# Patient Record
Sex: Female | Born: 1975 | Race: Black or African American | Hispanic: No | Marital: Single | Smoking: Never smoker
Health system: Southern US, Community
[De-identification: ages and names within clinical notes are randomized; demographics above are authoritative.]

## PROBLEM LIST (undated history)

## (undated) DIAGNOSIS — Z309 Encounter for contraceptive management, unspecified: Secondary | ICD-10-CM

## (undated) HISTORY — DX: Encounter for contraceptive management, unspecified: Z30.9

---

## 2001-02-26 ENCOUNTER — Other Ambulatory Visit: Admission: RE | Admit: 2001-02-26 | Discharge: 2001-02-26 | Payer: Self-pay | Admitting: *Deleted

## 2007-05-09 ENCOUNTER — Inpatient Hospital Stay (HOSPITAL_COMMUNITY): Admission: AD | Admit: 2007-05-09 | Discharge: 2007-05-11 | Payer: Self-pay | Admitting: Obstetrics & Gynecology

## 2007-08-21 ENCOUNTER — Other Ambulatory Visit: Admission: RE | Admit: 2007-08-21 | Discharge: 2007-08-21 | Payer: Self-pay | Admitting: Obstetrics and Gynecology

## 2007-12-27 ENCOUNTER — Other Ambulatory Visit: Admission: RE | Admit: 2007-12-27 | Discharge: 2007-12-27 | Payer: Self-pay | Admitting: Obstetrics and Gynecology

## 2008-08-27 ENCOUNTER — Other Ambulatory Visit: Admission: RE | Admit: 2008-08-27 | Discharge: 2008-08-27 | Payer: Self-pay | Admitting: Obstetrics & Gynecology

## 2008-12-31 ENCOUNTER — Other Ambulatory Visit: Admission: RE | Admit: 2008-12-31 | Discharge: 2008-12-31 | Payer: Self-pay | Admitting: Obstetrics and Gynecology

## 2009-08-31 ENCOUNTER — Other Ambulatory Visit: Admission: RE | Admit: 2009-08-31 | Discharge: 2009-08-31 | Payer: Self-pay | Admitting: Obstetrics & Gynecology

## 2010-01-01 ENCOUNTER — Other Ambulatory Visit: Admission: RE | Admit: 2010-01-01 | Discharge: 2010-01-01 | Payer: Self-pay | Admitting: Obstetrics and Gynecology

## 2011-03-22 NOTE — H&P (Signed)
NAMEJOYELL, Yesenia Hays              ACCOUNT NO.:  [id number]   MEDICAL RECORD NO.:  [id number]          PATIENT TYPE:  INP   LOCATION:  9175                          FACILITY:  [facility]   PHYSICIAN:  Tilda Burrow, M.D. DATE OF BIRTH:  07/10/1976   DATE OF ADMISSION:  05/09/2007  DATE OF DISCHARGE:                              HISTORY & PHYSICAL   ADMISSION DIAGNOSIS:  Pregnancy, 39+ weeks gestation.  Advanced cervical  favorability.  Elective induction of labor.   HISTORY OF PRESENT ILLNESS:  This 35 year old female gravida 3, para 1,  AB 1, LMP August 07, 2006, placing menstrual EDC on May 15, 2007 with  ultrasound-assigned [facility] of July 8 and July 3, based on first trimester  and second trimester ultrasounds.  The third trimester ultrasound  confirmed a healthy growth pattern.  The patient is admitted after being  seen in our office with a steady progression of her cervix to 3-4 cm,  50%, -1 mid position cervix, vertex presentation.  Patient requests  induction, so plans are to proceed with induction at this time.  She is  aware of the usual risks that labor can incur with induced labors,  including the need for emergency interventions, including cesarean  delivery.  Patient plans permanent contraception by tubal ligation after  her four week checkup.   PAST MEDICAL HISTORY:  Benign.   PAST SURGICAL HISTORY:  Negative.   ALLERGIES:  None.   Single.  Lives with her daughter.  Works at [employer] as a  Sports coach.   She wants an epidural if necessary.  Will attempt IV analgesics first.  Plans to take the baby to Dr. Milford Cage of [facility].  Plans  to bottle-feed.  The infant is a female.   PHYSICAL EXAMINATION:  GENERAL:  A slim-statured African-American  female.  VITAL SIGNS:  Height 5 feet 4.  Weight 142.  Blood pressure 112/68.  HEENT:  Pupils are equal, round and reactive.  NECK:  Supple.  CARDIOVASCULAR:  Unremarkable.  ABDOMEN:  Fundal height 37  cm.  Vertex presentation on the infant.  EXTREMITIES:  Grossly normal without clubbing, cyanosis or edema.   PRENATAL LABS:  Blood type O+.  Urine drug screen negative.  Rubella  immunity present.  Hemoglobin 12, hematocrit 38.  Hepatitis, HIV, RPR,  GC, and Chlamydia are negative.  Group B strep negative.  Sickledex  negative.   PLAN:  Admit on the evening of the 2nd for induction of labor by either  Pitocin induction, Cytotec, or Cervidil.      Tilda Burrow, M.D.  Electronically Signed     JVF/MEDQ  D:  05/09/2007  T:  05/10/2007  Job:  [id number]   cc:   Francoise Schaumann. Milford Cage DO, FAAP  Fax: [phone]

## 2011-03-22 NOTE — Op Note (Signed)
NAMEQUENTIN, STREBEL              ACCOUNT NO.:  [id number]   MEDICAL RECORD NO.:  [id number]          PATIENT TYPE:  INP   LOCATION:  9175                          FACILITY:  [facility]   PHYSICIAN:  Lazaro Arms, M.D.   DATE OF BIRTH:  01-Apr-1976   DATE OF PROCEDURE:  05/10/2007  DATE OF DISCHARGE:                               OPERATIVE REPORT   Yesenia Hays is a 35 year old African-American female, gravida 3, para 1,  abortus 1, being induced electively for a favorable cervix who had an  epidural placed during labor.  She progressed nicely through the first  stage of labor, had pretty significant variable decelerations in the  deceleration phase of labor, pushed for about 15 minutes and over an  intact perineum, delivered a viable female infant at [phone] with Apgars of  7 and 9, weighing 7 pounds and 7 ounces.  There was 3 vessel cord.  Cord  blood and cord gas were sent.  The perineum was intact without  lacerations.  The placenta delivered spontaneously and intact.  The  uterus was firm, 3 fingerbreadths below the umbilicus, and bleeding for  the delivery was probably less than 100 mL.  There was a body cord.  The  cord was actually looped around the back and then over the right  shoulder and then back to the umbilicus, and that was, I am sure, the  cause of decelerations.  There was not a nuchal cord or any other  compromise.  The cord pH was 7.1; the pCO2 is still pending, but my  impression is this would represent a respiratory acidosis purely.  There  was no evidence of any metabolic acidosis at all.  The patient will  undergo routine postpartum care.  The infant will go to routine neonatal  care, and she is group B strep negative.      Lazaro Arms, M.D.  Electronically Signed     LHE/MEDQ  D:  05/10/2007  T:  05/10/2007  Job:  [id number]

## 2011-03-22 NOTE — H&P (Signed)
Yesenia Hays, CAPSHAW              ACCOUNT NO.:  [id number]   MEDICAL RECORD NO.:  [id number]          PATIENT TYPE:  INP   LOCATION:  9175                          [facility]ACILITY[facility]  [facility]   PHYSICIAN:  Lazaro Arms, M.D.   DATE OF BIRTH:  1976/04/16   DATE OF ADMISSION:  05/09/2007  DATE OF DISCHARGE:  LH                              HISTORY & PHYSICAL   HISTORY AND PHYSICAL:  Yesenia Hays is a 35 year old gravida 3, para 1,  abortus 1, estimated date of delivery of May 15, 2007, currently at 43-  2/7 weeks' gestation, who was admitted for induction of labor with a  favorable cervix.  Dr. Emelda Fear arranged the induction.  It is elective,  but she is 4 cm.  The patient comes in not having complaints, has  reactive NST, no bleeding, no contractions, no complaints.  Her cervix  is 4, 50, and 01, vertex, very soft and midline.   PAST MEDICAL HISTORY:  Negative.   PAST SURGICAL HISTORY:  Negative.   PAST OBSTETRICAL HISTORY:  She had a miscarriage back in 2007.  She had  a vaginal delivery back in 2001, 7 pound 4 ounce, was induced for post  dates at 40 weeks 5 days.   Blood type is O positive.  Urine drug screen is negative.  Varicella is  immune.  Rubella is immune.  Hepatitis B was negative.  HIV was  nonreactive x2.  HSV was negative.  Serology was negative x2.  GC and  chlamydia were negative x2.  Pap smear was normal.  ARP was normal.  Group B strep was negative.  Glucola was 69.   REVIEW OF SYSTEMS:  Otherwise negative.   IMPRESSION:  1. Intrauterine pregnancy at 39-2/7 weeks' gestation.  2. Elective induction of labor with favorable cervix.   PLAN:  The patient is admitted for Pitocin induction of labor, to be  followed by amniotomy for expectant vaginal delivery.      Lazaro Arms, M.D.  Electronically Signed     LHE/MEDQ  D:  05/10/2007  T:  05/10/2007  Job:  [id number]

## 2011-08-23 LAB — CBC
HCT: 34.7 — ABNORMAL LOW
Hemoglobin: 9.5 — ABNORMAL LOW
Platelets: 316
RBC: 3.52 — ABNORMAL LOW
RDW: 16.6 — ABNORMAL HIGH
WBC: 14.4 — ABNORMAL HIGH
WBC: 8.6

## 2011-08-23 LAB — RPR: RPR Ser Ql: NONREACTIVE

## 2012-01-20 ENCOUNTER — Other Ambulatory Visit (HOSPITAL_COMMUNITY)
Admission: RE | Admit: 2012-01-20 | Discharge: 2012-01-20 | Disposition: A | Discharge status: Home / Self Care | Payer: Self-pay | Source: Ambulatory Visit | Attending: Obstetrics & Gynecology | Admitting: Obstetrics & Gynecology

## 2012-01-20 DIAGNOSIS — Z01419 Encounter for gynecological examination (general) (routine) without abnormal findings: Secondary | ICD-10-CM | POA: Insufficient documentation

## 2013-04-15 ENCOUNTER — Other Ambulatory Visit: Payer: Self-pay | Admitting: Obstetrics and Gynecology

## 2015-02-06 ENCOUNTER — Other Ambulatory Visit (HOSPITAL_COMMUNITY)
Admission: RE | Admit: 2015-02-06 | Discharge: 2015-02-06 | Disposition: A | Discharge status: Home / Self Care | Payer: BLUE CROSS/BLUE SHIELD | Source: Ambulatory Visit | Attending: Adult Health | Admitting: Adult Health

## 2015-02-06 ENCOUNTER — Ambulatory Visit (INDEPENDENT_AMBULATORY_CARE_PROVIDER_SITE_OTHER): Payer: BLUE CROSS/BLUE SHIELD | Admitting: Adult Health

## 2015-02-06 ENCOUNTER — Encounter: Payer: Self-pay | Admitting: Adult Health

## 2015-02-06 VITALS — BP 110/60 | HR 60 | Ht 63.0 in | Wt 162.0 lb

## 2015-02-06 DIAGNOSIS — Z1151 Encounter for screening for human papillomavirus (HPV): Secondary | ICD-10-CM | POA: Diagnosis present

## 2015-02-06 DIAGNOSIS — Z01419 Encounter for gynecological examination (general) (routine) without abnormal findings: Secondary | ICD-10-CM | POA: Insufficient documentation

## 2015-02-06 DIAGNOSIS — Z309 Encounter for contraceptive management, unspecified: Secondary | ICD-10-CM | POA: Insufficient documentation

## 2015-02-06 DIAGNOSIS — Z30011 Encounter for initial prescription of contraceptive pills: Secondary | ICD-10-CM

## 2015-02-06 HISTORY — DX: Encounter for contraceptive management, unspecified: Z30.9

## 2015-02-06 MED ORDER — NORETHIN-ETH ESTRAD-FE BIPHAS 1 MG-10 MCG / 10 MCG PO TABS: 1.0000 | ORAL_TABLET | Freq: Every day | ORAL | Status: DC

## 2015-02-06 NOTE — Patient Instructions (Signed)
Start lo loestrin with next period Use condoms Return in 3 months and get fasting labs Physical in 1 year Mammogram yearly at 72 Ethinyl Estradiol; Norethindrone Acetate; Ferrous fumarate tablets (contraception) What is this medicine? ETHINYL ESTRADIOL; NORETHINDRONE ACETATE; FERROUS FUMARATE (ETH in il es tra DYE ole; nor eth IN drone AS e tate; FER [place] FUE ma rate) is an oral contraceptive. The products combine two types of female hormones, an estrogen and a progestin. They are used to prevent ovulation and pregnancy. Some products are also used to treat acne in females. This medicine may be used for other purposes; ask your health care provider or pharmacist if you have questions. COMMON BRAND NAME(S): Estrostep Fe, Gildess Fe 1.5/30, Gildess Fe 1/20, Junel Fe 1.5/30, Junel Fe 1/20, Larin Fe, Lo Loestrin Fe, Loestrin 24 Fe, Loestrin FE 1.5/30, Loestrin FE 1/20, Lomedia 24 Fe, Microgestin Fe 1.5/30, Microgestin Fe 1/20, Tarina Fe 1/20, Tilia Fe, Tri-Legest Fe What should I tell my health care provider before I take this medicine? They need to know if you have any of these conditions: -abnormal vaginal bleeding -blood vessel disease -breast, cervical, endometrial, ovarian, liver, or uterine cancer -diabetes -gallbladder disease -heart disease or recent heart attack -high blood pressure -high cholesterol -history of blood clots -kidney disease -liver disease -migraine headaches -smoke tobacco -stroke -systemic lupus erythematosus (SLE) -an unusual or allergic reaction to estrogens, progestins, other medicines, foods, dyes, or preservatives -pregnant or trying to get pregnant -breast-feeding How should I use this medicine? Take this medicine by mouth. To reduce nausea, this medicine may be taken with food. Follow the directions on the prescription label. Take this medicine at the same time each day and in the order directed on the package. Do not take your medicine more often than  directed. A patient package insert for the product will be given with each prescription and refill. Read this sheet carefully each time. The sheet may change frequently. Contact your pediatrician regarding the use of this medicine in children. Special care may be needed. This medicine has been used in female children who have started having menstrual periods. Overdosage: If you think you've taken too much of this medicine contact a poison control center or emergency room at once. Overdosage: If you think you have taken too much of this medicine contact a poison control center or emergency room at once. NOTE: This medicine is only for you. Do not share this medicine with others. What if I miss a dose? If you miss a dose, refer to the patient information sheet you received with your medicine for direction. If you miss more than one pill, this medicine may not be as effective and you may need to use another form of birth control. What may interact with this medicine? -acetaminophen -antibiotics or medicines for infections, especially rifampin, rifabutin, rifapentine, and griseofulvin, and possibly penicillins or tetracyclines -aprepitant -ascorbic acid (vitamin C) -atorvastatin -barbiturate medicines, such as phenobarbital -bosentan -carbamazepine -caffeine -clofibrate -cyclosporine -dantrolene -doxercalciferol -felbamate -grapefruit juice -hydrocortisone -medicines for anxiety or sleeping problems, such as diazepam or temazepam -medicines for diabetes, including pioglitazone -mineral oil -modafinil -mycophenolate -nefazodone -oxcarbazepine -phenytoin -prednisolone -ritonavir or other medicines for HIV infection or AIDS -rosuvastatin -selegiline -soy isoflavones supplements -St. John's wort -tamoxifen or raloxifene -theophylline -thyroid hormones -topiramate -warfarin This list may not describe all possible interactions. Give your health care provider a list of all the  medicines, herbs, non-prescription drugs, or dietary supplements you use. Also tell them if you smoke, drink alcohol, or  use illegal drugs. Some items may interact with your medicine. What should I watch for while using this medicine? Visit your doctor or health care professional for regular checks on your progress. You will need a regular breast and pelvic exam and Pap smear while on this medicine. Use an additional method of contraception during the first cycle that you take these tablets. If you have any reason to think you are pregnant, stop taking this medicine right away and contact your doctor or health care professional. If you are taking this medicine for hormone related problems, it may take several cycles of use to see improvement in your condition. Smoking increases the risk of getting a blood clot or having a stroke while you are taking birth control pills, especially if you are more than 39 years old. You are strongly advised not to smoke. This medicine can make your body retain fluid, making your fingers, hands, or ankles swell. Your blood pressure can go up. Contact your doctor or health care professional if you feel you are retaining fluid. This medicine can make you more sensitive to the sun. Keep out of the sun. If you cannot avoid being in the sun, wear protective clothing and use sunscreen. Do not use sun lamps or tanning beds/booths. If you wear contact lenses and notice visual changes, or if the lenses begin to feel uncomfortable, consult your eye care specialist. In some women, tenderness, swelling, or minor bleeding of the gums may occur. Notify your dentist if this happens. Brushing and flossing your teeth regularly may help limit this. See your dentist regularly and inform your dentist of the medicines you are taking. If you are going to have elective surgery, you may need to stop taking this medicine before the surgery. Consult your health care professional for advice. This  medicine does not protect you against HIV infection (AIDS) or any other sexually transmitted diseases. What side effects may I notice from receiving this medicine? Side effects that you should report to your doctor or health care professional as soon as possible: -allergic reactions like skin rash, itching or hives, swelling of the face, lips, or tongue -breast tissue changes or discharge -changes in vaginal bleeding during your period or between your periods -changes in vision -chest pain -confusion -coughing up blood -dizziness -feeling faint or lightheaded -headaches or migraines -leg, arm or groin pain -loss of balance or coordination -severe or sudden headaches -stomach pain (severe) -sudden shortness of breath -sudden numbness or weakness of the face, arm or leg -symptoms of vaginal infection like itching, irritation or unusual discharge -tenderness in the upper abdomen -trouble speaking or understanding -vomiting -yellowing of the eyes or skin Side effects that usually do not require medical attention (Report these to your doctor or health care professional if they continue or are bothersome.): -breakthrough bleeding and spotting that continues beyond the 3 initial cycles of pills -breast tenderness -mood changes, anxiety, depression, frustration, anger, or emotional outbursts -increased sensitivity to sun or ultraviolet light -nausea -skin rash, acne, or brown spots on the skin -weight gain (slight) This list may not describe all possible side effects. Call your doctor for medical advice about side effects. You may report side effects to FDA at 1-800-FDA-1088. Where should I keep my medicine? Keep out of the reach of children. Store at room temperature between 15 and 30 degrees C (59 and 86 degrees F). Throw away any unused medicine after the expiration date. NOTE: This sheet is a summary. It may not cover  all possible information. If you have questions about this  medicine, talk to your doctor, pharmacist, or health care provider.  2015, Elsevier/Gold Standard. (2013-02-27 15:05:22)

## 2015-02-06 NOTE — Progress Notes (Signed)
Patient ID: Yesenia LankSerena R Randon, female   DOB: 1976/08/30, 39 y.o.   MRN: [id number] History of Present Illness: Casimer BilisSerena is a 39 year old black female, single in for well woman gyn exam and pap and wants to get on OCs.Has been using condoms.  Current Medications, Allergies, Past Medical History, Past Surgical History, Family History and Social History were reviewed in [facility] electronic medical record.     Review of Systems: Patient denies any headaches, hearing loss, fatigue, blurred vision, shortness of breath, chest pain, abdominal pain, problems with bowel movements, urination, or intercourse. No joint pain or mood swings.    Physical Exam:BP 110/60 mmHg  Pulse 60  Ht 5\' 3"  (1.6 m)  Wt 162 lb (73.483 kg)  BMI 28.70 kg/m2  LMP 01/19/2015 General:  Well developed, well nourished, no acute distress Skin:  Warm and dry Neck:  Midline trachea, normal thyroid, good ROM, no lymphadenopathy Lungs; Clear to auscultation bilaterally Breast:  No dominant palpable mass, retraction, or nipple discharge Cardiovascular: Regular rate and rhythm Abdomen:  Soft, non tender, no hepatosplenomegaly Pelvic:  External genitalia is normal in appearance, no lesions.  The vagina is normal in appearance, with good color, moisture and rugae.Marland Kitchen. Urethra has no lesions or masses. The cervix is bulbous. Pap with HPV performed. Uterus is felt to be normal size, shape, and contour.  No adnexal masses or tenderness noted.Bladder is non tender, no masses felt. Extremities/musculoskeletal:  No swelling or varicosities noted, no clubbing or cyanosis Psych:  No mood changes, alert and cooperative,seems happy   Impression: Well woman gyn exam with pap Contraceptive management    Plan: Rx lo loestrin take 1 daily disp 1 pack with 11 refills, start with next period Use condoms Return in 3 months for follow up on the pill and get fasting labs Physical in 1 year Mammogram yearly at 40 Review handout on lo  loestrin

## 2015-02-09 LAB — CYTOLOGY - PAP

## 2015-05-08 ENCOUNTER — Other Ambulatory Visit: Payer: BLUE CROSS/BLUE SHIELD

## 2016-12-08 ENCOUNTER — Encounter: Payer: Self-pay | Admitting: Adult Health

## 2016-12-08 ENCOUNTER — Ambulatory Visit (INDEPENDENT_AMBULATORY_CARE_PROVIDER_SITE_OTHER): Payer: BLUE CROSS/BLUE SHIELD | Admitting: Adult Health

## 2016-12-08 VITALS — BP 120/78 | HR 88 | Ht 63.0 in | Wt 162.5 lb

## 2016-12-08 DIAGNOSIS — Z01419 Encounter for gynecological examination (general) (routine) without abnormal findings: Secondary | ICD-10-CM

## 2016-12-08 DIAGNOSIS — Z1211 Encounter for screening for malignant neoplasm of colon: Secondary | ICD-10-CM | POA: Diagnosis not present

## 2016-12-08 DIAGNOSIS — Z131 Encounter for screening for diabetes mellitus: Secondary | ICD-10-CM

## 2016-12-08 DIAGNOSIS — Z1212 Encounter for screening for malignant neoplasm of rectum: Secondary | ICD-10-CM

## 2016-12-08 LAB — HEMOCCULT GUIAC POC 1CARD (OFFICE): FECAL OCCULT BLD: NEGATIVE

## 2016-12-08 NOTE — Patient Instructions (Signed)
Physical and pap in 1 year Mammogram now and yearly

## 2016-12-08 NOTE — Progress Notes (Signed)
Patient ID: Yesenia Hays, female   DOB: 1976-03-31, 41 y.o.   MRN: [id number] History of Present Illness:  Yesenia Hays is a 41 year old black female in for a well woman gyn exam,she had a normal pap with negative HPV 02/06/15.She has no complaints today.  Current Medications, Allergies, Past Medical History, Past Surgical History, Family History and Social History were reviewed in [facility] electronic medical record.     Review of Systems: Patient denies any headaches, hearing loss, fatigue, blurred vision, shortness of breath, chest pain, abdominal pain, problems with bowel movements, urination, or intercourse. No joint pain or mood swings.She is using condoms and is happy with that.    Physical Exam:BP 120/78 (BP Location: Left Arm, Patient Position: Sitting, Cuff Size: Normal)   Pulse 88   Ht 5\' 3"  (1.6 m)   Wt 162 lb 8 oz (73.7 kg)   LMP 11/16/2016 (Exact Date)   BMI 28.79 kg/m  General:  Well developed, well nourished, no acute distress Skin:  Warm and dry Neck:  Midline trachea, normal thyroid, good ROM, no lymphadenopathy Lungs; Clear to auscultation bilaterally Breast:  No dominant palpable mass, retraction, or nipple discharge Cardiovascular: Regular rate and rhythm Abdomen:  Soft, non tender, no hepatosplenomegaly Pelvic:  External genitalia is normal in appearance, no lesions.  The vagina is normal in appearance. Urethra has no lesions or masses. The cervix is smooth.  Uterus is felt to be normal size, shape, and contour.  No adnexal masses or tenderness noted.Bladder is non tender, no masses felt. Rectal: Good sphincter tone, no polyps, or hemorrhoids felt.  Hemoccult negative. Extremities/musculoskeletal:  No swelling or varicosities noted, no clubbing or cyanosis Psych:  No mood changes, alert and cooperative,seems happy PHQ 2 score 0.  Impression: 1. Well woman exam with routine gynecological exam   2. Screening for colorectal cancer   3. Screening for diabetes  mellitus       Plan: Check CBC,CMP,TSH and lipids,A1c and vitamin D Pap and physical in 1 year Mammogram now and yearly

## 2016-12-26 ENCOUNTER — Telehealth: Payer: Self-pay | Admitting: Adult Health

## 2016-12-26 NOTE — Telephone Encounter (Signed)
Pt aware of labs, increase activity and take 2000 IU vitamin D everyday.will get labs scanned in

## 2017-01-24 ENCOUNTER — Other Ambulatory Visit: Payer: Self-pay | Admitting: Adult Health

## 2017-01-24 DIAGNOSIS — Z1231 Encounter for screening mammogram for malignant neoplasm of breast: Secondary | ICD-10-CM

## 2017-01-25 ENCOUNTER — Ambulatory Visit (HOSPITAL_COMMUNITY)
Admission: RE | Admit: 2017-01-25 | Discharge: 2017-01-25 | Disposition: A | Discharge status: Home / Self Care | Payer: BLUE CROSS/BLUE SHIELD | Source: Ambulatory Visit | Attending: Adult Health | Admitting: Adult Health

## 2017-01-25 ENCOUNTER — Encounter (HOSPITAL_COMMUNITY): Payer: Self-pay

## 2017-01-25 DIAGNOSIS — Z1231 Encounter for screening mammogram for malignant neoplasm of breast: Secondary | ICD-10-CM | POA: Insufficient documentation

## 2018-02-15 ENCOUNTER — Other Ambulatory Visit: Payer: Self-pay | Admitting: Adult Health

## 2018-02-15 DIAGNOSIS — Z1231 Encounter for screening mammogram for malignant neoplasm of breast: Secondary | ICD-10-CM

## 2018-02-19 ENCOUNTER — Encounter (HOSPITAL_COMMUNITY): Payer: Self-pay

## 2018-02-19 ENCOUNTER — Ambulatory Visit (HOSPITAL_COMMUNITY)
Admission: RE | Admit: 2018-02-19 | Discharge: 2018-02-19 | Disposition: A | Discharge status: Home / Self Care | Payer: BLUE CROSS/BLUE SHIELD | Source: Ambulatory Visit | Attending: Adult Health | Admitting: Adult Health

## 2018-02-19 DIAGNOSIS — Z1231 Encounter for screening mammogram for malignant neoplasm of breast: Secondary | ICD-10-CM | POA: Diagnosis not present

## 2018-04-09 ENCOUNTER — Encounter: Payer: Self-pay | Admitting: Obstetrics and Gynecology

## 2018-04-09 ENCOUNTER — Other Ambulatory Visit (HOSPITAL_COMMUNITY)
Admission: RE | Admit: 2018-04-09 | Discharge: 2018-04-09 | Disposition: A | Discharge status: Home / Self Care | Payer: BLUE CROSS/BLUE SHIELD | Source: Ambulatory Visit | Attending: Adult Health | Admitting: Adult Health

## 2018-04-09 ENCOUNTER — Ambulatory Visit (INDEPENDENT_AMBULATORY_CARE_PROVIDER_SITE_OTHER): Payer: BLUE CROSS/BLUE SHIELD | Admitting: Obstetrics and Gynecology

## 2018-04-09 VITALS — BP 110/76 | HR 79 | Ht 62.0 in | Wt 170.2 lb

## 2018-04-09 DIAGNOSIS — Z01419 Encounter for gynecological examination (general) (routine) without abnormal findings: Secondary | ICD-10-CM | POA: Insufficient documentation

## 2018-04-09 NOTE — Progress Notes (Signed)
  Assessment:  Annual Gyn Exam Plan:  1. pap smear done, next pap due 3 years 2. return annually or prn 3    Annual mammogram advised after age 42 Subjective:  Yesenia Hays is a 42 y.o. female G1P1001 who presents for annual exam. Patient's last menstrual period was 03/29/2018. The patient has denies any complaints or complications as of today.  The following portions of the patient's history were reviewed and updated as appropriate: allergies, current medications, past family history, past medical history, past social history, past surgical history and problem list. Past Medical History:  Diagnosis Date  . Contraceptive management 02/06/2015    History reviewed. No pertinent surgical history.  No current outpatient medications on file.  Review of Systems Constitutional: negative Gastrointestinal: negative Genitourinary: negative  Objective:  BP 110/76 (BP Location: Right Arm, Patient Position: Sitting, Cuff Size: Normal)   Pulse 79   Ht 5\' 2"  (1.575 m)   Wt 170 lb 3.2 oz (77.2 kg)   LMP 03/29/2018   BMI 31.13 kg/m    BMI: Body mass index is 31.13 kg/m.  General Appearance: Alert, appropriate appearance for age. No acute distress HEENT: Grossly normal Neck / Thyroid:  Cardiovascular: RRR; normal S1, S2, no murmur Lungs: CTA bilaterally Back: No CVAT Breast Exam: Patient denied breast exam due to 3D MM on 02/19/2018  Gastrointestinal: Soft, non-tender, no masses or organomegaly Pelvic exam: External genetalia: Normal VULVA: normal appearing vulva with no masses, tenderness or lesions VAGINA: normal appearing vagina with normal color and discharge, no lesions CERVIX: normal appearing cervix without discharge or lesions UTERUS: good muscle tone with  ADNEXA: normal adnexa in size, nontender and no masses PAP: Pap smear done today. RECTAL: guaiac negative stool obtained.  Lymphatic Exam: Non-palpable nodes in neck, clavicular, axillary, or inguinal regions Skin: no  rash or abnormalities Neurologic: Normal gait and speech, no tremor  Psychiatric: Alert and oriented, appropriate affect.  Urinalysis:Not done  Christin BachJohn Katrinia Straker. MD Pgr [phone] 2:59 PM   By signing my name below, I, Arnette NorrisMari Johnson, attest that this documentation has been prepared under the direction and in the presence of Tilda BurrowFerguson, Johaan Ryser V, MD Electronically Signed: Arnette NorrisMari Johnson Medical Scribe. 04/09/18. 2:59 PM.  I personally performed the services described in this documentation, which was SCRIBED in my presence. The recorded information has been reviewed and considered accurate. It has been edited as necessary during review. Tilda BurrowJohn V Becka Lagasse, MD

## 2018-04-12 LAB — CYTOLOGY - PAP
DIAGNOSIS: NEGATIVE
DIAGNOSIS: REACTIVE
HPV: DETECTED — AB

## 2018-04-17 ENCOUNTER — Other Ambulatory Visit: Payer: BLUE CROSS/BLUE SHIELD | Admitting: Adult Health

## 2019-04-10 ENCOUNTER — Other Ambulatory Visit (HOSPITAL_COMMUNITY): Payer: Self-pay | Admitting: Adult Health

## 2019-04-10 DIAGNOSIS — Z1231 Encounter for screening mammogram for malignant neoplasm of breast: Secondary | ICD-10-CM

## 2019-04-11 ENCOUNTER — Ambulatory Visit (HOSPITAL_COMMUNITY)
Admission: RE | Admit: 2019-04-11 | Discharge: 2019-04-11 | Disposition: A | Discharge status: Home / Self Care | Payer: BC Managed Care – PPO | Source: Ambulatory Visit | Attending: Adult Health | Admitting: Adult Health

## 2019-04-11 ENCOUNTER — Other Ambulatory Visit: Payer: Self-pay

## 2019-04-11 DIAGNOSIS — Z1231 Encounter for screening mammogram for malignant neoplasm of breast: Secondary | ICD-10-CM | POA: Diagnosis not present

## 2019-05-16 ENCOUNTER — Other Ambulatory Visit: Payer: Self-pay

## 2019-05-16 ENCOUNTER — Other Ambulatory Visit: Payer: BC Managed Care – PPO

## 2019-05-16 DIAGNOSIS — Z20822 Contact with and (suspected) exposure to covid-19: Secondary | ICD-10-CM

## 2019-05-16 NOTE — Progress Notes (Signed)
lab

## 2019-05-20 LAB — NOVEL CORONAVIRUS, NAA: SARS-CoV-2, NAA: NOT DETECTED

## 2019-11-22 ENCOUNTER — Other Ambulatory Visit: Payer: Self-pay

## 2019-11-22 ENCOUNTER — Ambulatory Visit: Payer: BC Managed Care – PPO | Attending: Internal Medicine

## 2019-11-22 DIAGNOSIS — Z20822 Contact with and (suspected) exposure to covid-19: Secondary | ICD-10-CM

## 2019-11-23 LAB — NOVEL CORONAVIRUS, NAA: SARS-CoV-2, NAA: NOT DETECTED

## 2020-01-05 ENCOUNTER — Ambulatory Visit: Payer: BC Managed Care – PPO | Attending: Internal Medicine

## 2020-01-05 DIAGNOSIS — Z23 Encounter for immunization: Secondary | ICD-10-CM

## 2020-01-05 NOTE — Progress Notes (Signed)
   Covid-19 Vaccination Clinic  Name:  Yesenia Hays    MRN: [id number] DOB: 12/12/1975  01/05/2020  [name] was observed post Covid-19 immunization for 15 minutes without incidence. She was provided with Vaccine Information Sheet and instruction to access the V-Safe system.   [name] was instructed to call 911 with any severe reactions post vaccine: Marland Kitchen Difficulty breathing  . Swelling of your face and throat  . A fast heartbeat  . A bad rash all over your body  . Dizziness and weakness    Immunizations Administered    Name Date Dose VIS Date Route   Moderna COVID-19 Vaccine 01/05/2020  1:10 PM 0.5 mL 10/08/2019 Intramuscular   Manufacturer: Moderna   Lot: [id number]   NDC: 81388-719-59

## 2020-02-08 ENCOUNTER — Ambulatory Visit: Payer: BC Managed Care – PPO | Attending: Internal Medicine

## 2020-02-08 DIAGNOSIS — Z23 Encounter for immunization: Secondary | ICD-10-CM

## 2020-02-08 NOTE — Progress Notes (Signed)
   Covid-19 Vaccination Clinic  Name:  Yesenia Hays    MRN: [id number] DOB: Mar 16, 1976  02/08/2020  [name] was observed post Covid-19 immunization for 15 minutes without incident. She was provided with Vaccine Information Sheet and instruction to access the V-Safe system.   [name] was instructed to call 911 with any severe reactions post vaccine: Marland Kitchen Difficulty breathing  . Swelling of face and throat  . A fast heartbeat  . A bad rash all over body  . Dizziness and weakness   Immunizations Administered    Name Date Dose VIS Date Route   Moderna COVID-19 Vaccine 02/08/2020 10:50 AM 0.5 mL 10/08/2019 Intramuscular   Manufacturer: Moderna   Lot: [id number]   NDC: 34196-222-97

## 2020-05-07 ENCOUNTER — Other Ambulatory Visit (HOSPITAL_COMMUNITY): Payer: Self-pay | Admitting: Adult Health

## 2020-05-07 DIAGNOSIS — Z1231 Encounter for screening mammogram for malignant neoplasm of breast: Secondary | ICD-10-CM

## 2020-05-25 ENCOUNTER — Ambulatory Visit (HOSPITAL_COMMUNITY)
Admission: RE | Admit: 2020-05-25 | Discharge: 2020-05-25 | Disposition: A | Discharge status: Home / Self Care | Payer: BC Managed Care – PPO | Source: Ambulatory Visit | Attending: Adult Health | Admitting: Adult Health

## 2020-05-25 ENCOUNTER — Other Ambulatory Visit: Payer: Self-pay

## 2020-05-25 DIAGNOSIS — Z1231 Encounter for screening mammogram for malignant neoplasm of breast: Secondary | ICD-10-CM | POA: Diagnosis not present

## 2021-04-12 IMAGING — MG DIGITAL SCREENING BILATERAL MAMMOGRAM WITH TOMO AND CAD
8 series · 9 of 24 positions shown · non-contrast
Comparison: Previous exam(s).

CLINICAL DATA: Screening.

EXAM:
DIGITAL SCREENING BILATERAL MAMMOGRAM WITH TOMO AND CAD

[L MLO synth-2D]
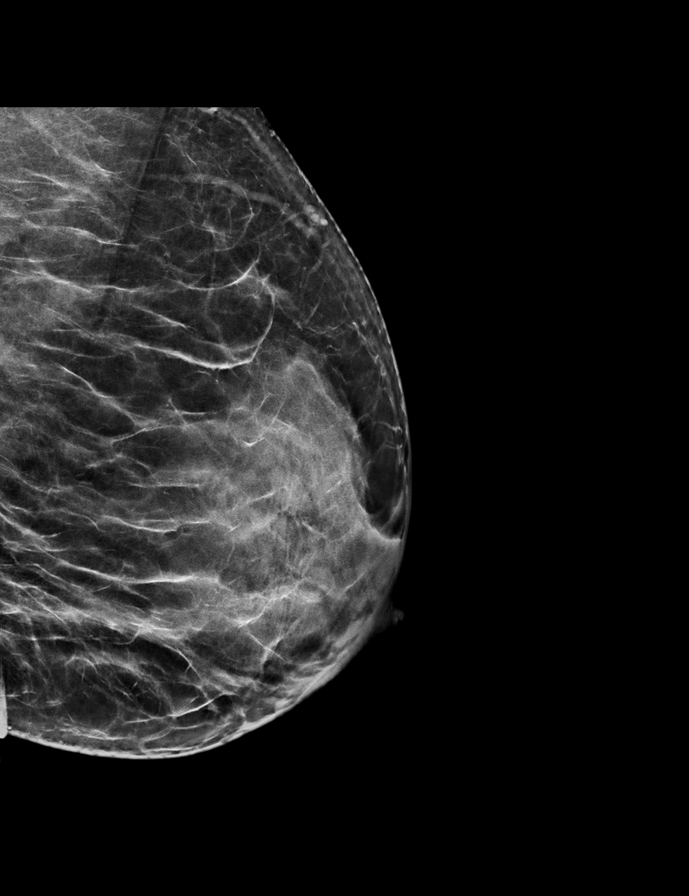

[R CC synth-2D]
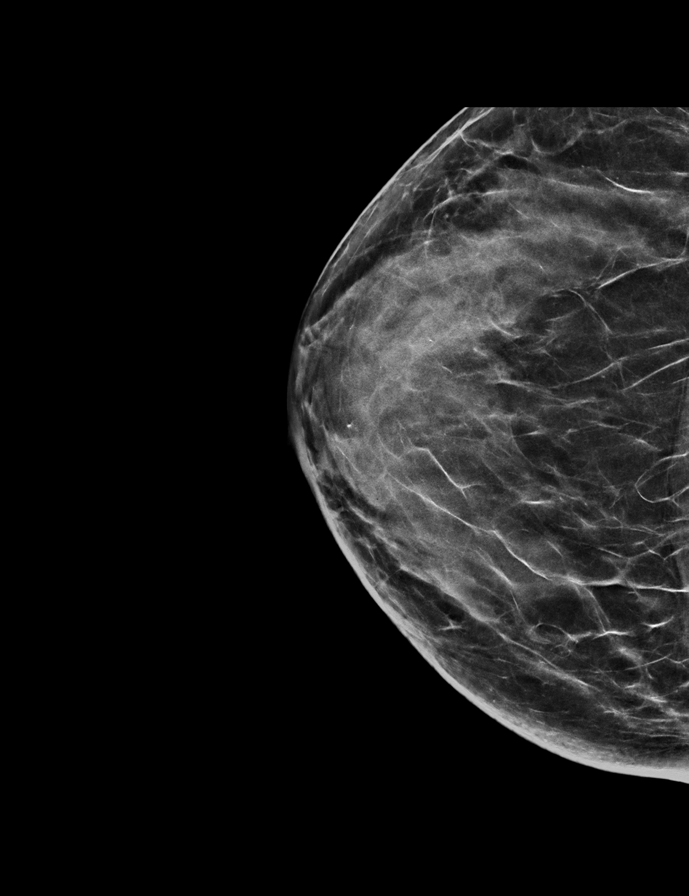

[L CC synth-2D]
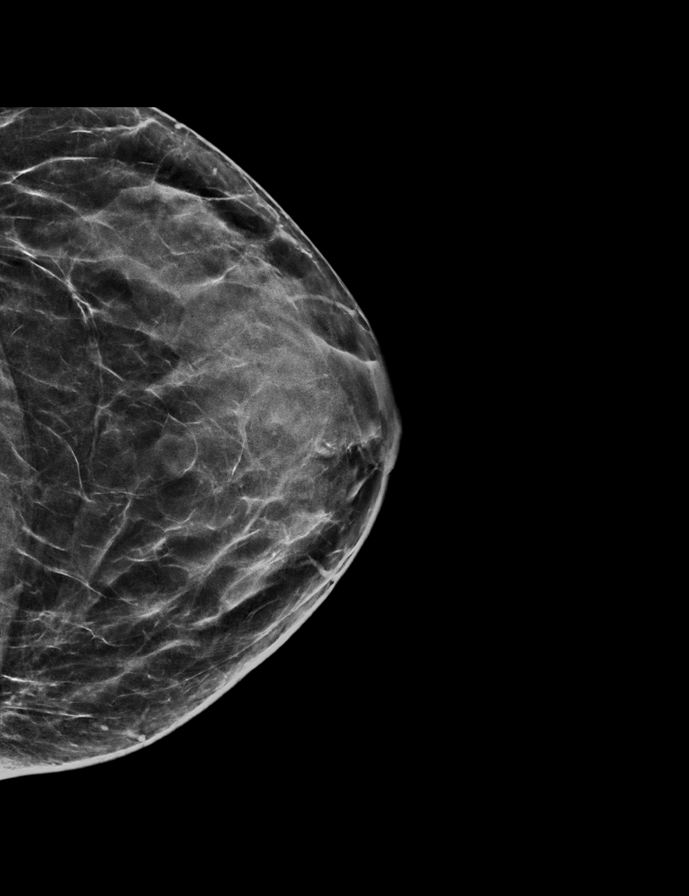

[R MLO synth-2D]
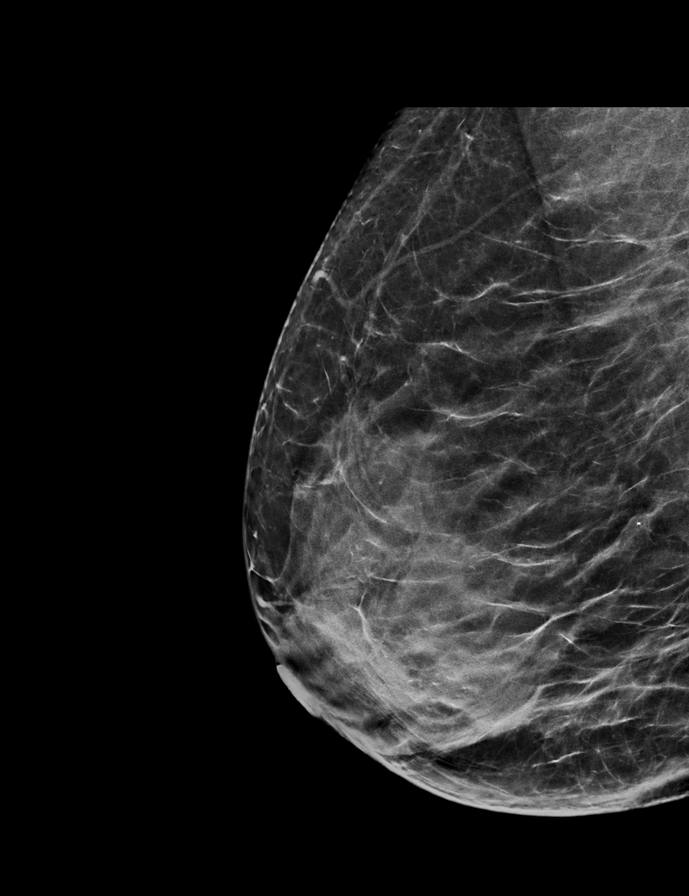

[R CC tomo · 2 of 57 frames shown]
[frame 19/57]
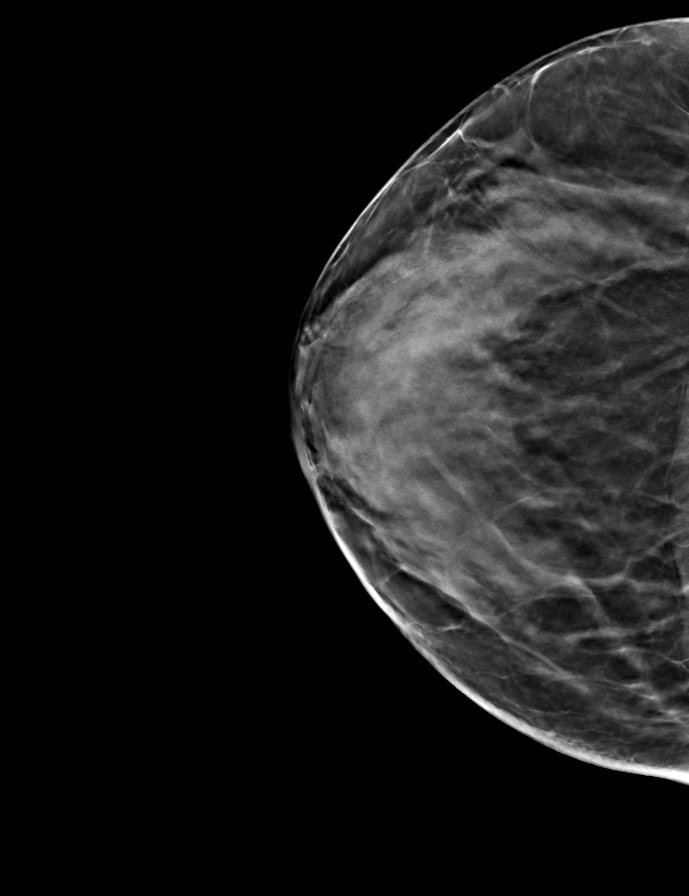
[frame 29/57]
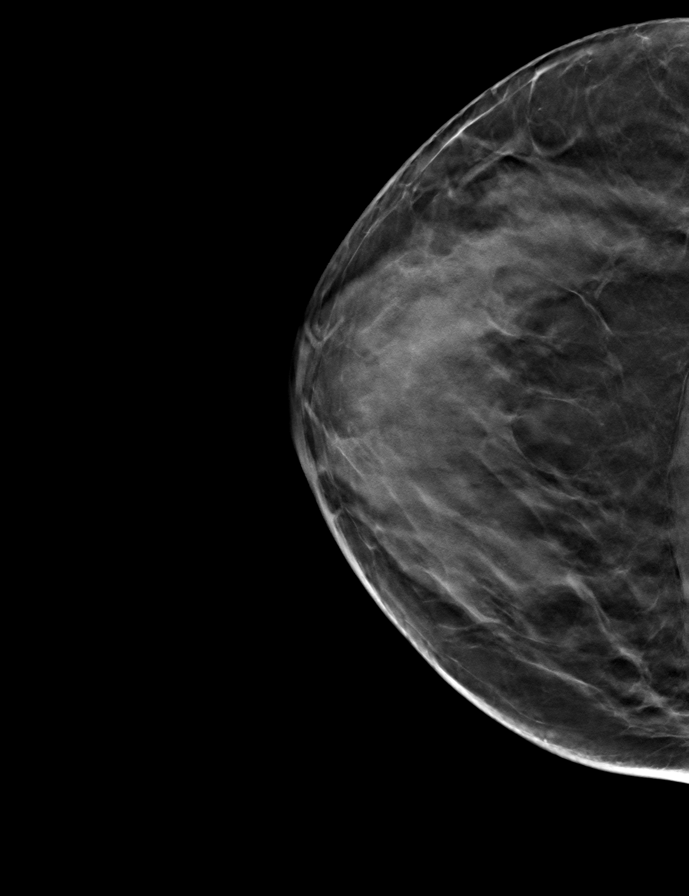

[L MLO tomo · tomo slice 30/59.0]
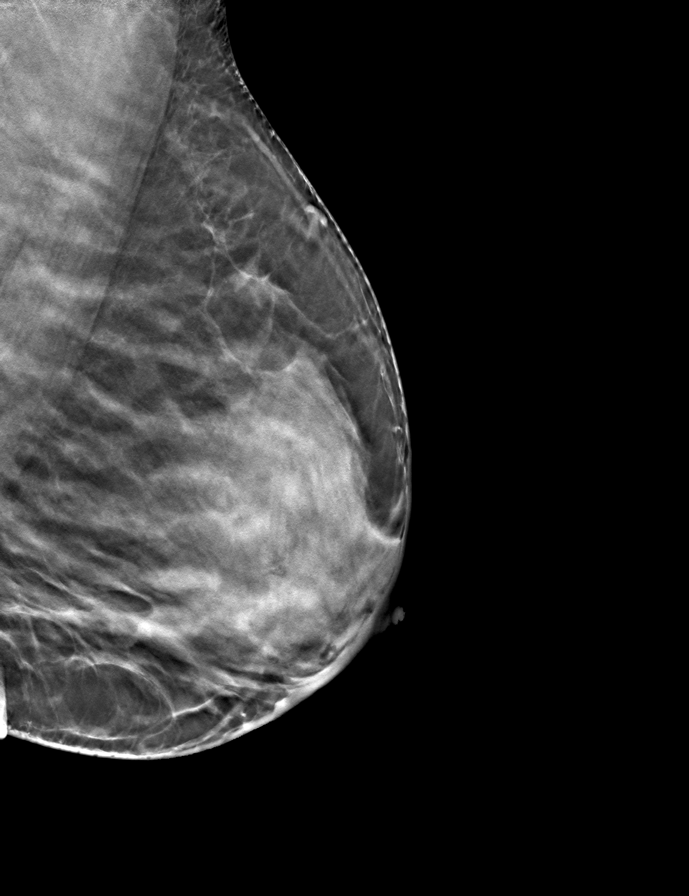

[L CC tomo · tomo slice 29/56.0]
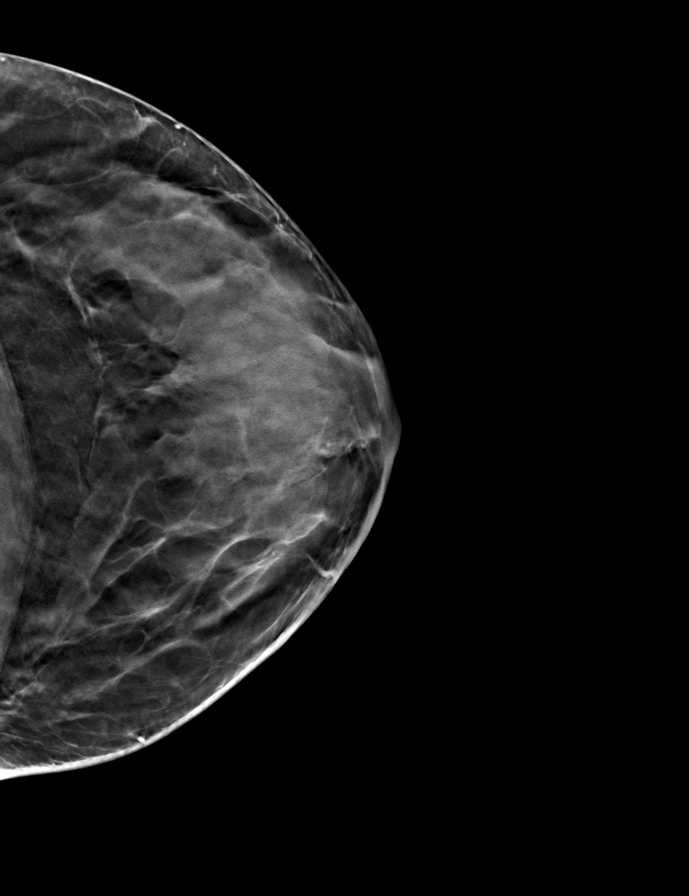

[R MLO tomo · tomo slice 30/59.0]
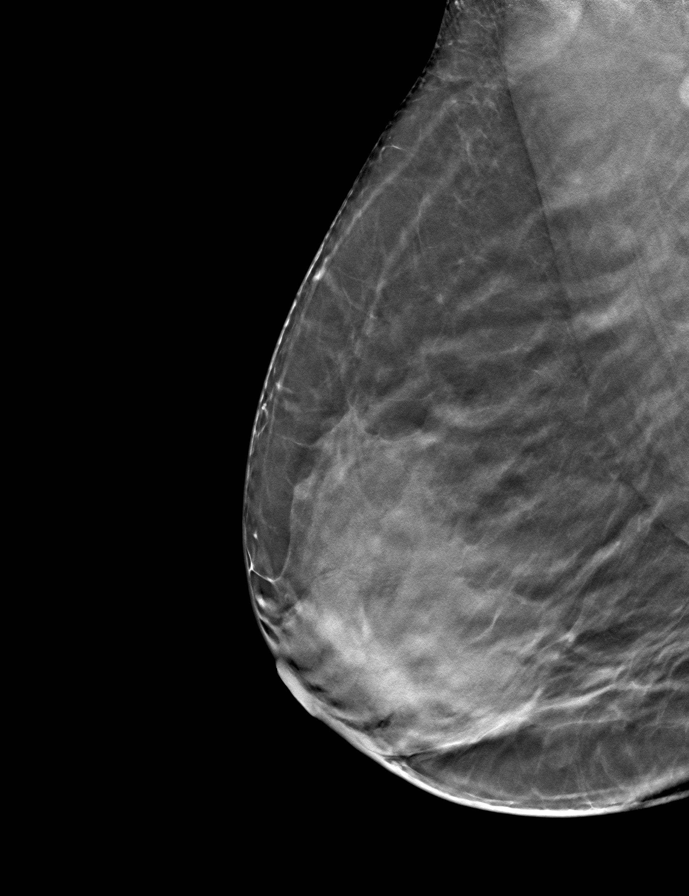

[9 of 24 positions shown; findings below may reference images not displayed]

ACR Breast Density Category c: The breast tissue is heterogeneously
dense, which may obscure small masses.
FINDINGS: There are no findings suspicious for malignancy. Images were
processed with CAD.
IMPRESSION: No mammographic evidence of malignancy. A result letter of this
screening mammogram will be mailed directly to the patient.

RECOMMENDATION:
Screening mammogram in one year. (Code:[id number]-[id number])

BI-RADS CATEGORY  1: Negative.

## 2021-05-20 ENCOUNTER — Other Ambulatory Visit (HOSPITAL_COMMUNITY): Payer: Self-pay | Admitting: Adult Health

## 2021-05-20 DIAGNOSIS — Z1231 Encounter for screening mammogram for malignant neoplasm of breast: Secondary | ICD-10-CM

## 2021-05-27 ENCOUNTER — Other Ambulatory Visit: Payer: Self-pay

## 2021-05-27 ENCOUNTER — Ambulatory Visit (HOSPITAL_COMMUNITY)
Admission: RE | Admit: 2021-05-27 | Discharge: 2021-05-27 | Disposition: A | Discharge status: Home / Self Care | Payer: BC Managed Care – PPO | Source: Ambulatory Visit | Attending: Adult Health | Admitting: Adult Health

## 2021-05-27 DIAGNOSIS — Z1231 Encounter for screening mammogram for malignant neoplasm of breast: Secondary | ICD-10-CM | POA: Diagnosis not present

## 2022-01-24 ENCOUNTER — Encounter: Payer: Self-pay | Admitting: Adult Health

## 2022-01-24 ENCOUNTER — Other Ambulatory Visit (HOSPITAL_COMMUNITY)
Admission: RE | Admit: 2022-01-24 | Discharge: 2022-01-24 | Disposition: A | Discharge status: Home / Self Care | Payer: BC Managed Care – PPO | Source: Ambulatory Visit | Attending: Adult Health | Admitting: Adult Health

## 2022-01-24 ENCOUNTER — Other Ambulatory Visit: Payer: Self-pay

## 2022-01-24 ENCOUNTER — Ambulatory Visit (INDEPENDENT_AMBULATORY_CARE_PROVIDER_SITE_OTHER): Payer: BC Managed Care – PPO | Admitting: Adult Health

## 2022-01-24 VITALS — BP 126/72 | HR 53 | Ht 63.0 in | Wt 162.0 lb

## 2022-01-24 DIAGNOSIS — Z124 Encounter for screening for malignant neoplasm of cervix: Secondary | ICD-10-CM

## 2022-01-24 DIAGNOSIS — N921 Excessive and frequent menstruation with irregular cycle: Secondary | ICD-10-CM

## 2022-01-24 DIAGNOSIS — Z01419 Encounter for gynecological examination (general) (routine) without abnormal findings: Secondary | ICD-10-CM | POA: Insufficient documentation

## 2022-01-24 DIAGNOSIS — K5900 Constipation, unspecified: Secondary | ICD-10-CM

## 2022-01-24 DIAGNOSIS — Z7689 Persons encountering health services in other specified circumstances: Secondary | ICD-10-CM | POA: Insufficient documentation

## 2022-01-24 DIAGNOSIS — Z1211 Encounter for screening for malignant neoplasm of colon: Secondary | ICD-10-CM | POA: Diagnosis not present

## 2022-01-24 LAB — HEMOCCULT GUIAC POC 1CARD (OFFICE): Fecal Occult Blood, POC: NEGATIVE

## 2022-01-24 MED ORDER — LO LOESTRIN FE 1 MG-10 MCG / 10 MCG PO TABS: 1.0000 | ORAL_TABLET | Freq: Every day | ORAL | 4 refills | Status: DC

## 2022-01-24 NOTE — Progress Notes (Signed)
Patient ID: Yesenia Hays, female   DOB: 01-16-1976, 46 y.o.   MRN: [id number] ?History of Present Illness: ?Tinea is a 46 year old black female, single, G1P1 in for gyn exam and pap. She had physical 01/20/22 with PCP, and labs the week before. She is working at [employer]. ?PCP is Dr Margo Aye. ? ? ?Current Medications, Allergies, Past Medical History, Past Surgical History, Family History and Social History were reviewed in [employer] record.   ? ? ?Review of Systems: ?Patient denies any headaches, hearing loss, fatigue, blurred vision, shortness of breath, chest pain, abdominal pain, problems with bowel movements,(constipated at times) urination, or intercourse. No joint pain or mood swings.  ?Periods last about 10 days, will be spotting then stop then heavy then spotting. Cramps mild. ?She denies history of MI,stroke, DVT, breast cancer or migraine with aura ? ?Physical Exam:BP 126/72 (BP Location: Right Arm, Patient Position: Sitting, Cuff Size: Normal)   Pulse (!) 53   Ht 5\' 3"  (1.6 m)   Wt 162 lb (73.5 kg)   LMP 01/07/2022 (Exact Date)   BMI 28.70 kg/m?   ?General:  Well developed, well nourished, no acute distress ?Skin:  Warm and dry ?Neck:  Midline trachea, normal thyroid, good ROM, no lymphadenopathy ?Lungs; Clear to auscultation bilaterally ?Breast:  No dominant palpable mass, retraction, or nipple discharge ?Cardiovascular: Regular rate and rhythm ?Abdomen:  Soft, non tender, no hepatosplenomegaly ?Pelvic:  External genitalia is normal in appearance, no lesions.  The vagina is normal in appearance. Urethra has no lesions or masses. The cervix is smooth, pap with HR HPV genotyping performed.  Uterus is felt to be normal size, shape, and contour.  No adnexal masses or tenderness noted.Bladder is non tender, no masses felt. ?Rectal: Good sphincter tone, no polyps, or hemorrhoids felt.  Hemoccult negative.+stool in vault. ?Extremities/musculoskeletal:  No swelling or varicosities  noted, no clubbing or cyanosis ?Psych:  No mood changes, alert and cooperative,seems happy ?AA is 2 ?Fall risk is low ?Depression screen [facility] 2/9 01/24/2022 12/08/2016  ?Decreased Interest 0 0  ?Down, Depressed, Hopeless 0 0  ?PHQ - 2 Score 0 0  ?Altered sleeping 1 -  ?Tired, decreased energy 1 -  ?Change in appetite 0 -  ?Feeling bad or failure about yourself  1 -  ?Trouble concentrating 0 -  ?Moving slowly or fidgety/restless 0 -  ?Suicidal thoughts 0 -  ?PHQ-9 Score 3 -  ?  ?GAD 7 : Generalized Anxiety Score 01/24/2022  ?Nervous, Anxious, on Edge 0  ?Control/stop worrying 1  ?Worry too much - different things 1  ?Trouble relaxing 1  ?Restless 0  ?Easily annoyed or irritable 1  ?Afraid - awful might happen 0  ?Total GAD 7 Score 4  ? ?  ? Upstream - 01/24/22 1141   ? ?  ? Pregnancy Intention Screening  ? Does the patient want to become pregnant in the next year? No   ? Does the patient's partner want to become pregnant in the next year? No   ? Would the patient like to discuss contraceptive options today? Yes   ?  ? Contraception Wrap Up  ? Current Method Female Condom   ? End Method Female Condom;Oral Contraceptive   ? Contraception Counseling Provided Yes   ? ?  ?  ? ?  ? Examination chaperoned by 01/26/22 LPN ? ? ?Impression and Plan: ?1. Encounter for gynecological examination with Papanicolaou smear of cervix ?Pap sent ?Physical with PCP ?Pap in  3 if normal ?Labs with PCP ?Colonoscopy or cologuard advised  ? ?2. Encounter for screening fecal occult blood testing ? ? ?3. Menorrhagia with irregular cycle ?Will try lo Loestrin, 2 packs given to start with next period and use condoms, and gave discount card ?Meds ordered this encounter  ?Medications  ? Norethindrone-Ethinyl Estradiol-Fe Biphas (LO LOESTRIN FE) 1 MG-10 MCG / 10 MCG tablet  ?  Sig: Take 1 tablet by mouth daily. Take 1 daily by mouth  ?  Dispense:  84 tablet  ?  Refill:  4  ?  BIN [id number], PCN [id number], GRP [id number]  ?  Order Specific  Question:   Supervising Provider  ?  Answer:   Duane Lope H [[id number]]  ?  ?Follow up in 3 months  for ROS  ? ?4. Constipation, unspecified constipation type ?Increase water and fiber ? ?5. Encounter for menstrual regulation ?Will try lo loestrin  ? ? ? ?  ?  ?

## 2022-01-25 LAB — CYTOLOGY - PAP
Adequacy: ABSENT
Comment: NEGATIVE
Diagnosis: NEGATIVE
High risk HPV: NEGATIVE

## 2022-04-20 ENCOUNTER — Other Ambulatory Visit (HOSPITAL_COMMUNITY): Payer: Self-pay | Admitting: Adult Health

## 2022-04-20 DIAGNOSIS — Z1231 Encounter for screening mammogram for malignant neoplasm of breast: Secondary | ICD-10-CM

## 2022-04-29 ENCOUNTER — Encounter: Payer: Self-pay | Admitting: Adult Health

## 2022-04-29 ENCOUNTER — Ambulatory Visit: Payer: BC Managed Care – PPO | Admitting: Adult Health

## 2022-04-29 VITALS — BP 131/79 | HR 64 | Ht 63.0 in | Wt 160.0 lb

## 2022-04-29 DIAGNOSIS — Z3041 Encounter for surveillance of contraceptive pills: Secondary | ICD-10-CM

## 2022-04-29 DIAGNOSIS — N921 Excessive and frequent menstruation with irregular cycle: Secondary | ICD-10-CM | POA: Diagnosis not present

## 2022-04-29 MED ORDER — NEXTSTELLIS 3-14.2 MG PO TABS
ORAL_TABLET | ORAL | 0 refills | Status: DC
Start: 2022-04-29 — End: 2023-01-27

## 2022-06-03 ENCOUNTER — Ambulatory Visit (HOSPITAL_COMMUNITY)
Admission: RE | Admit: 2022-06-03 | Discharge: 2022-06-03 | Disposition: A | Discharge status: Home / Self Care | Payer: BC Managed Care – PPO | Source: Ambulatory Visit | Attending: Adult Health | Admitting: Adult Health

## 2022-06-03 DIAGNOSIS — Z1231 Encounter for screening mammogram for malignant neoplasm of breast: Secondary | ICD-10-CM | POA: Diagnosis present

## 2022-07-29 ENCOUNTER — Ambulatory Visit: Payer: BC Managed Care – PPO | Admitting: Adult Health

## 2022-07-29 ENCOUNTER — Encounter: Payer: Self-pay | Admitting: Adult Health

## 2022-07-29 VITALS — BP 118/70 | HR 66 | Ht 62.0 in | Wt 162.4 lb

## 2022-07-29 DIAGNOSIS — N921 Excessive and frequent menstruation with irregular cycle: Secondary | ICD-10-CM | POA: Diagnosis not present

## 2022-07-29 DIAGNOSIS — Z3041 Encounter for surveillance of contraceptive pills: Secondary | ICD-10-CM | POA: Diagnosis not present

## 2022-07-29 NOTE — Progress Notes (Signed)
  Subjective:     Patient ID: Yesenia Hays, female   DOB: 1976-10-13, 46 y.o.   MRN: [id number]  Yesenia Hays is a 46 year old black female, single, G1P1 back in follow up on start Nextstellis and periods much better.   PCP is Dr Nevada Crane.  Last pap was 01/24/22. High risk HPV Negative   Adequacy Satisfactory for evaluation; transformation zone component ABSENT.   Diagnosis - Negative for intraepithelial lesion or malignancy (NILM)    Review of Systems Periods lighter and shorter, <5 days Reviewed past medical,surgical, social and family history. Reviewed medications and allergies.     Objective:   Physical Exam BP 118/70 (BP Location: Right Arm, Patient Position: Sitting, Cuff Size: Normal)   Pulse 66   Ht 5\' 2"  (1.575 m)   Wt 162 lb 6.4 oz (73.7 kg)   LMP 07/18/2022   BMI 29.70 kg/m     Skin warm and dry.  Lungs: clear to ausculation bilaterally. Cardiovascular: regular rate and rhythm.  Fall risk is low  Upstream - 07/29/22 7989       Pregnancy Intention Screening   Does the patient want to become pregnant in the next year? No    Does the patient's partner want to become pregnant in the next year? No    Would the patient like to discuss contraceptive options today? No      Contraception Wrap Up   Current Method Oral Contraceptive    End Method Oral Contraceptive    Contraception Counseling Provided No             Assessment:     1. Menorrhagia with irregular cycle Periods are lighter and shorter, much better   2. Encounter for surveillance of contraceptive pills Continue Nextstellis   5 sample packs given of Nextstellis   Plan:     Return in 6 months for physical

## 2023-01-27 ENCOUNTER — Ambulatory Visit (INDEPENDENT_AMBULATORY_CARE_PROVIDER_SITE_OTHER): Payer: Managed Care, Other (non HMO) | Admitting: Adult Health

## 2023-01-27 ENCOUNTER — Encounter: Payer: Self-pay | Admitting: Adult Health

## 2023-01-27 VITALS — BP 111/64 | HR 80 | Ht 62.0 in | Wt 165.0 lb

## 2023-01-27 DIAGNOSIS — Z1339 Encounter for screening examination for other mental health and behavioral disorders: Secondary | ICD-10-CM | POA: Diagnosis not present

## 2023-01-27 DIAGNOSIS — Z3041 Encounter for surveillance of contraceptive pills: Secondary | ICD-10-CM

## 2023-01-27 DIAGNOSIS — N921 Excessive and frequent menstruation with irregular cycle: Secondary | ICD-10-CM

## 2023-01-27 DIAGNOSIS — Z1211 Encounter for screening for malignant neoplasm of colon: Secondary | ICD-10-CM

## 2023-01-27 DIAGNOSIS — Z1212 Encounter for screening for malignant neoplasm of rectum: Secondary | ICD-10-CM

## 2023-01-27 DIAGNOSIS — Z01419 Encounter for gynecological examination (general) (routine) without abnormal findings: Secondary | ICD-10-CM

## 2023-01-27 LAB — HEMOCCULT GUIAC POC 1CARD (OFFICE): Fecal Occult Blood, POC: NEGATIVE

## 2023-01-27 MED ORDER — NEXTSTELLIS 3-14.2 MG PO TABS: ORAL_TABLET | ORAL | 4 refills | Status: AC

## 2023-01-27 NOTE — Progress Notes (Signed)
Patient ID: Yesenia Hays, female   DOB: 06-09-1976, 47 y.o.   MRN: [id number] History of Present Illness: Yesenia Hays is a 47 year old black female,single, G1P1001, in for a well woman gyn exam. Her periods are lighter are nextstellis but does have some BTB that is brown with some cramping at times.   Last pap was negative HPV and NILM 01/24/22.  PCP is Dr Nevada Crane.   Current Medications, Allergies, Past Medical History, Past Surgical History, Family History and Social History were reviewed in [employer] record.     Review of Systems: Patient denies any headaches, hearing loss, fatigue, blurred vision, shortness of breath, chest pain, abdominal pain, problems with bowel movements, urination, or intercourse. No joint pain or mood swings.  +BTB   Physical Exam:BP 111/64 (BP Location: Left Arm, Patient Position: Sitting, Cuff Size: Normal)   Pulse 80   Ht 5\' 2"  (1.575 m)   Wt 165 lb (74.8 kg)   LMP 01/06/2023   BMI 30.18 kg/m   General:  Well developed, well nourished, no acute distress Skin:  Warm and dry Neck:  Midline trachea, normal thyroid, good ROM, no lymphadenopathy Lungs; Clear to auscultation bilaterally Breast:  No dominant palpable mass, retraction, or nipple discharge Cardiovascular: Regular rate and rhythm Abdomen:  Soft, non tender, no hepatosplenomegaly Pelvic:  External genitalia is normal in appearance, no lesions.  The vagina is normal in appearance,+brown discharge. Urethra has no lesions or masses. The cervix is bulbous.  Uterus is felt to be normal size, shape, and contour.  No adnexal masses or tenderness noted.Bladder is non tender, no masses felt. Rectal: Good sphincter tone, no polyps, or hemorrhoids felt.  Hemoccult negative. Extremities/musculoskeletal:  No swelling or varicosities noted, no clubbing or cyanosis Psych:  No mood changes, alert and cooperative,seems happy AA is 2 Fall risk is low    01/27/2023    8:36 AM 01/24/2022   11:41  AM 12/08/2016    9:05 AM  Depression screen PHQ 2/9  Decreased Interest 0 0 0  Down, Depressed, Hopeless 0 0 0  PHQ - 2 Score 0 0 0  Altered sleeping 0 1   Tired, decreased energy 0 1   Change in appetite 0 0   Feeling bad or failure about yourself  0 1   Trouble concentrating 0 0   Moving slowly or fidgety/restless 0 0   Suicidal thoughts 0 0   PHQ-9 Score 0 3        01/27/2023    8:36 AM 01/24/2022   11:41 AM  GAD 7 : Generalized Anxiety Score  Nervous, Anxious, on Edge 0 0  Control/stop worrying 0 1  Worry too much - different things 0 1  Trouble relaxing 0 1  Restless 0 0  Easily annoyed or irritable 0 1  Afraid - awful might happen 0 0  Total GAD 7 Score 0 4      Upstream - 01/27/23 0839       Pregnancy Intention Screening   Does the patient want to become pregnant in the next year? No    Does the patient's partner want to become pregnant in the next year? No    Would the patient like to discuss contraceptive options today? No      Contraception Wrap Up   Current Method Oral Contraceptive    End Method Oral Contraceptive             Examination chaperoned by Levy Pupa LPN  Impression  and Plan: 1. Encounter for well woman exam with routine gynecological exam Physical in 1 year Pap in 2026 Labs with PCP Mammogram was negative 06/03/22  2. Encounter for screening fecal occult blood testing Hemoccult was negative  - POCT occult blood stool  3. Encounter for surveillance of contraceptive pills Will continue Nextstellis Meds ordered this encounter  Medications   Drospirenone-Estetrol (NEXTSTELLIS) 3-14.2 MG TABS    Sig: Take 1 daily    Dispense:  84 tablet    Refill:  4    Order Specific Question:   Supervising Provider    Answer:   Elonda Husky, LUTHER H [[id number]]     4. Irregular intermenstrual bleeding Has BTB but wants to continue Nextstellis  5. Screening for colorectal cancer Ordered Cologuard  - Cologuard

## 2023-02-23 LAB — COLOGUARD: COLOGUARD: NEGATIVE

## 2023-05-29 ENCOUNTER — Other Ambulatory Visit (HOSPITAL_COMMUNITY): Payer: Self-pay | Admitting: Adult Health

## 2023-05-29 DIAGNOSIS — Z1231 Encounter for screening mammogram for malignant neoplasm of breast: Secondary | ICD-10-CM

## 2023-06-07 ENCOUNTER — Ambulatory Visit (HOSPITAL_COMMUNITY)
Admission: RE | Admit: 2023-06-07 | Discharge: 2023-06-07 | Disposition: A | Discharge status: Home / Self Care | Payer: Managed Care, Other (non HMO) | Source: Ambulatory Visit | Attending: Adult Health | Admitting: Adult Health

## 2023-06-07 DIAGNOSIS — Z1231 Encounter for screening mammogram for malignant neoplasm of breast: Secondary | ICD-10-CM | POA: Insufficient documentation

## 2024-06-17 ENCOUNTER — Other Ambulatory Visit (HOSPITAL_COMMUNITY): Payer: Self-pay | Admitting: Adult Health

## 2024-06-17 DIAGNOSIS — Z1231 Encounter for screening mammogram for malignant neoplasm of breast: Secondary | ICD-10-CM

## 2024-06-26 ENCOUNTER — Encounter (HOSPITAL_COMMUNITY): Payer: Self-pay

## 2024-06-26 ENCOUNTER — Ambulatory Visit (HOSPITAL_COMMUNITY)
Admission: RE | Admit: 2024-06-26 | Discharge: 2024-06-26 | Disposition: A | Discharge status: Home / Self Care | Source: Ambulatory Visit | Attending: Adult Health | Admitting: Adult Health

## 2024-06-26 DIAGNOSIS — Z1231 Encounter for screening mammogram for malignant neoplasm of breast: Secondary | ICD-10-CM | POA: Insufficient documentation

## 2024-07-01 ENCOUNTER — Ambulatory Visit: Payer: Self-pay | Admitting: Adult Health
